# Patient Record
Sex: Female | Born: 1940 | Race: Black or African American | Hispanic: No | Smoking: Never smoker
Health system: Southern US, Community
[De-identification: ages and names within clinical notes are randomized; demographics above are authoritative.]

## PROBLEM LIST (undated history)

## (undated) DIAGNOSIS — I2510 Atherosclerotic heart disease of native coronary artery without angina pectoris: Secondary | ICD-10-CM

## (undated) DIAGNOSIS — I10 Essential (primary) hypertension: Secondary | ICD-10-CM

## (undated) DIAGNOSIS — I739 Peripheral vascular disease, unspecified: Secondary | ICD-10-CM

## (undated) DIAGNOSIS — J45909 Unspecified asthma, uncomplicated: Secondary | ICD-10-CM

## (undated) DIAGNOSIS — K219 Gastro-esophageal reflux disease without esophagitis: Secondary | ICD-10-CM

## (undated) DIAGNOSIS — R7303 Prediabetes: Secondary | ICD-10-CM

---

## 2009-01-20 ENCOUNTER — Observation Stay (HOSPITAL_COMMUNITY): Admission: RE | Admit: 2009-01-20 | Discharge: 2009-01-21 | Payer: Self-pay | Admitting: Orthopedic Surgery

## 2010-03-20 LAB — BASIC METABOLIC PANEL
BUN: 13 mg/dL (ref 6–23)
Calcium: 9.8 mg/dL (ref 8.4–10.5)
Creatinine, Ser: 1.01 mg/dL (ref 0.4–1.2)
GFR calc non Af Amer: 55 mL/min — ABNORMAL LOW (ref >60)

## 2010-03-20 LAB — POCT I-STAT 4, (NA,K, GLUC, HGB,HCT)
Hemoglobin: 12.9 g/dL (ref 12.0–15.0)
Sodium: 139 mEq/L (ref 135–145)

## 2011-05-26 IMAGING — CR DG OR PORTABLE SPINE
1 series · 1 of 1 positions shown · non-contrast
Comparison: Preoperative plain film examination 01/17/2009.

CLINICAL DATA: L3-4 foraminal disc.

PORTABLE SPINE

[view not recorded]
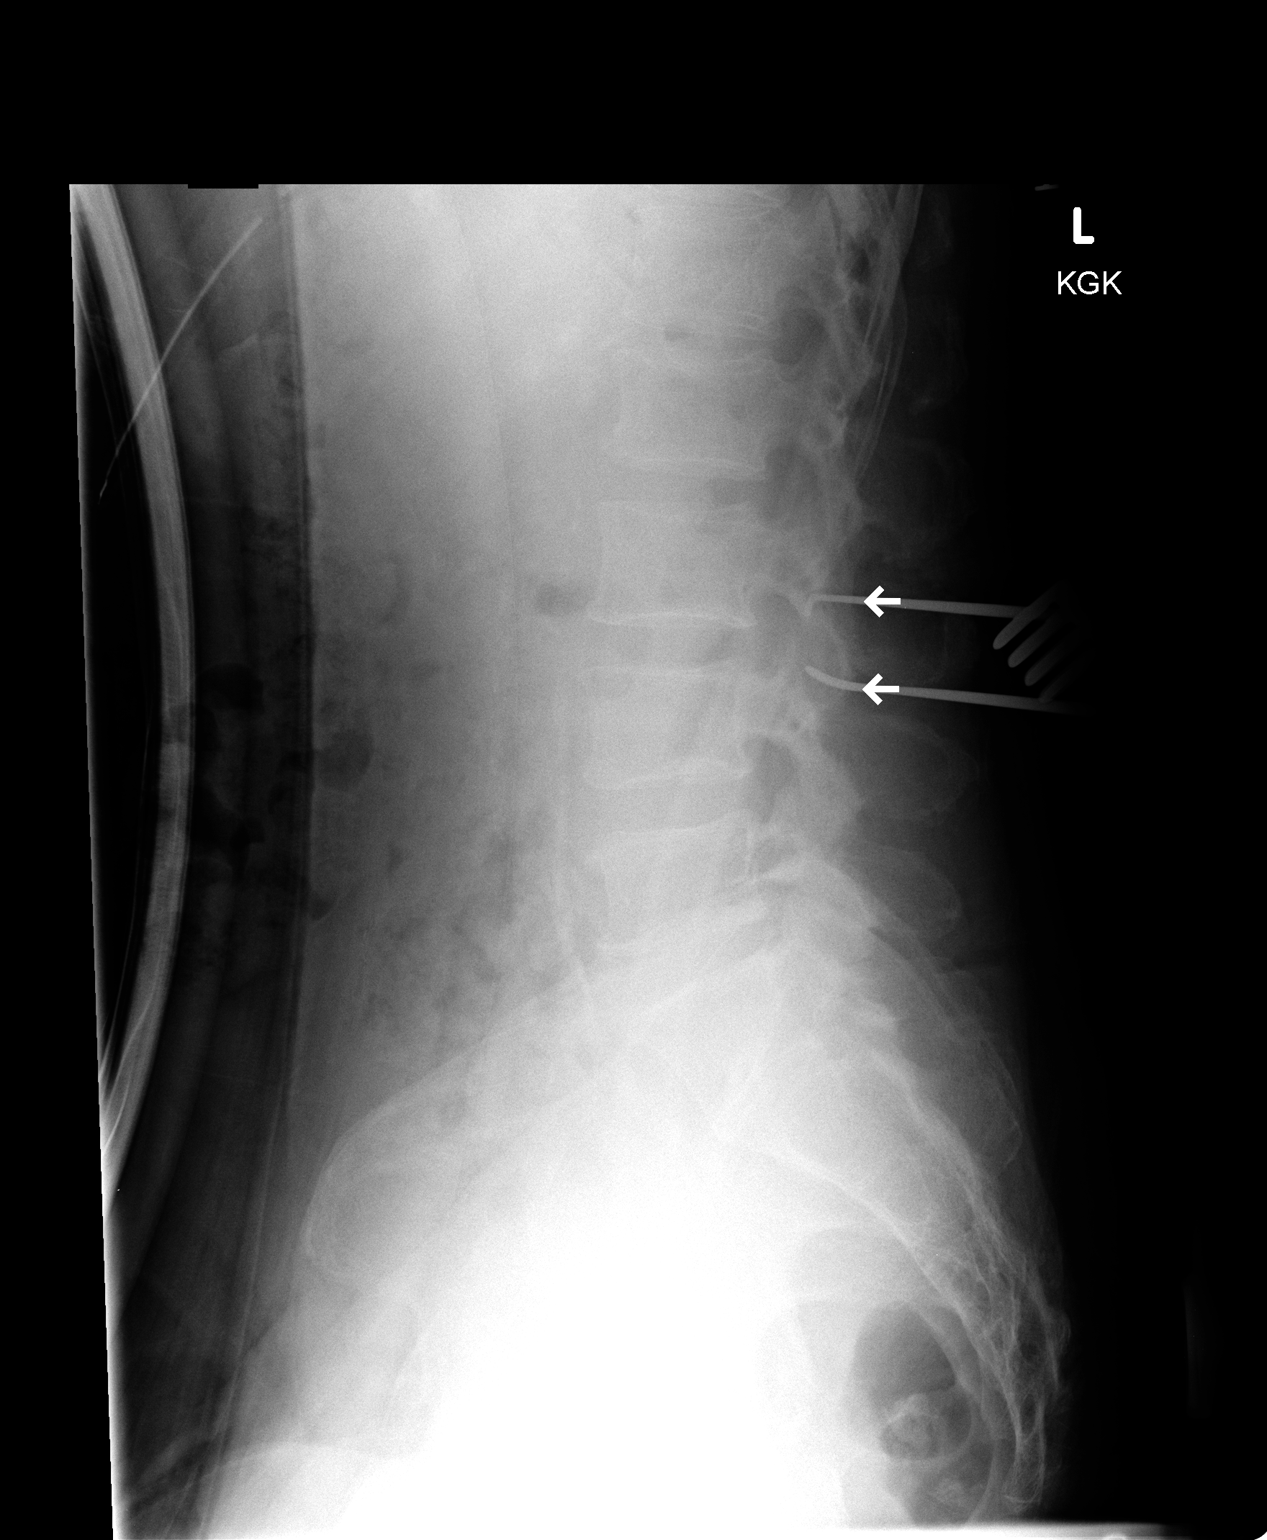

[1 of 1 positions shown; findings below may reference images not displayed]

FINDINGS: Transitional vertebra has previously been labeled S1 with
a rudimentary disc at the S1-S2 level.  Correlation with any
outside MR/post myelogram CT recommended to confirm level
assignment.

Utilizing this level assignment, metallic probes are seen just
above and just below the L3-4 disc space.
IMPRESSION: Utilizing prior level assignment, metallic probes are seen just
above and just below the L3-4 disc space.

## 2011-05-26 IMAGING — CR DG OR PORTABLE SPINE
1 series · 1 of 1 positions shown · non-contrast
Comparison: 01/20/2009.

CLINICAL DATA: Left L3-4 foraminal disc herniation.

PORTABLE SPINE

[view not recorded]
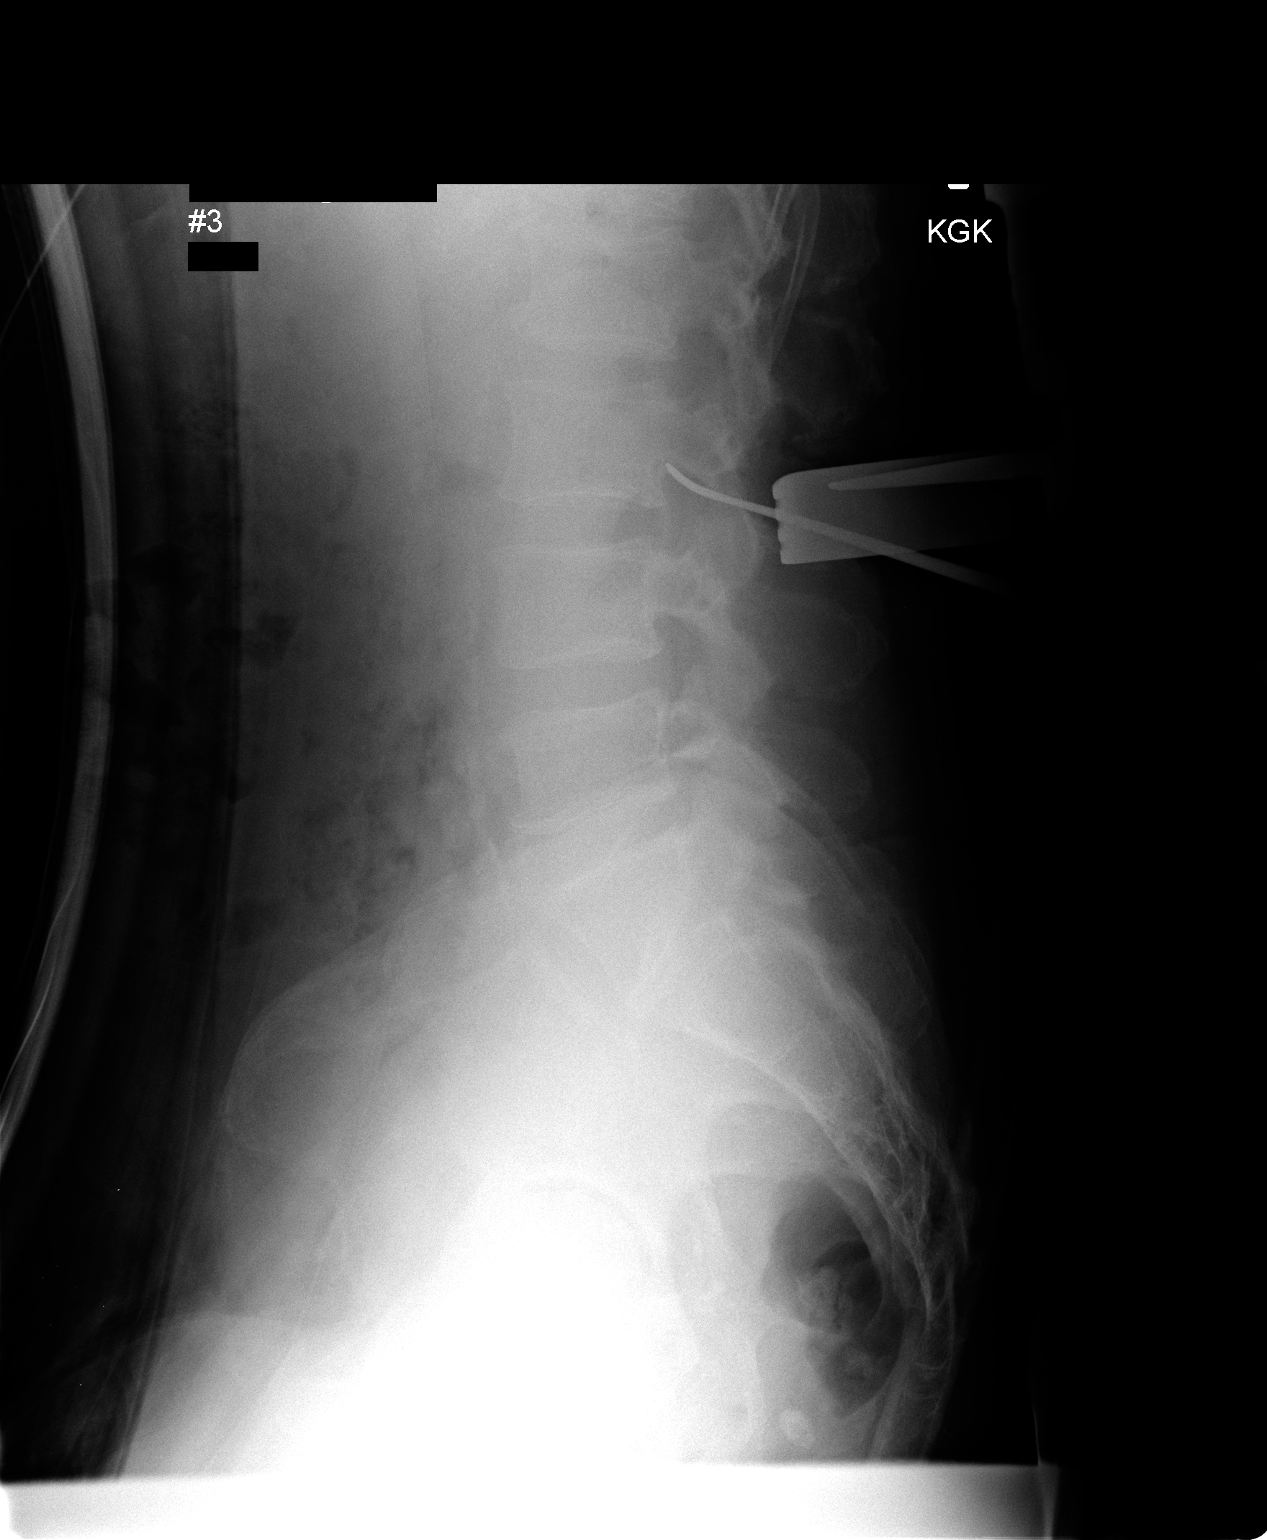

[1 of 1 positions shown; findings below may reference images not displayed]

FINDINGS: S1 is labeled a transitional vertebra as identified on
previous studies.

There is a surgical instrument located below the L3 pedicle.
Tissue spreaders are present posteriorly at this level.  Normal
lumbar alignment without fracture.
IMPRESSION: Surgical instrument below the pedicle of L3.

## 2022-04-18 ENCOUNTER — Encounter (HOSPITAL_BASED_OUTPATIENT_CLINIC_OR_DEPARTMENT_OTHER): Payer: Self-pay | Admitting: Orthopedic Surgery

## 2022-04-19 ENCOUNTER — Encounter (HOSPITAL_BASED_OUTPATIENT_CLINIC_OR_DEPARTMENT_OTHER)
Admission: RE | Admit: 2022-04-19 | Discharge: 2022-04-19 | Disposition: A | Discharge status: Home / Self Care | Payer: Medicaid Other | Source: Ambulatory Visit | Attending: Orthopedic Surgery | Admitting: Orthopedic Surgery

## 2022-04-19 ENCOUNTER — Encounter (HOSPITAL_BASED_OUTPATIENT_CLINIC_OR_DEPARTMENT_OTHER): Payer: Self-pay | Admitting: Orthopedic Surgery

## 2022-04-19 DIAGNOSIS — Z01818 Encounter for other preprocedural examination: Secondary | ICD-10-CM | POA: Diagnosis present

## 2022-04-19 DIAGNOSIS — I517 Cardiomegaly: Secondary | ICD-10-CM | POA: Diagnosis not present

## 2022-04-19 LAB — BASIC METABOLIC PANEL
Anion gap: 8 (ref 5–15)
BUN: 8 mg/dL (ref 8–23)
CO2: 24 mmol/L (ref 22–32)
Calcium: 9.3 mg/dL (ref 8.9–10.3)
Chloride: 108 mmol/L (ref 98–111)
Creatinine, Ser: 0.72 mg/dL (ref 0.44–1.00)
GFR, Estimated: >60 mL/min (ref >60)
Glucose, Bld: 132 mg/dL — ABNORMAL HIGH (ref 70–99)
Potassium: 3.7 mmol/L (ref 3.5–5.1)
Sodium: 140 mmol/L (ref 135–145)

## 2022-04-19 NOTE — Progress Notes (Signed)
Left voicemail with Dr. Georgia Duff surgery scheduler about requesting more cardiac notes/office visits or and tests such as an echo.

## 2022-04-24 NOTE — Progress Notes (Signed)
Called and left message with Dr. Percell Locus office, that we are waiting on cardiac office note.

## 2022-04-25 ENCOUNTER — Ambulatory Visit (HOSPITAL_BASED_OUTPATIENT_CLINIC_OR_DEPARTMENT_OTHER): Admission: RE | Admit: 2022-04-25 | Payer: Medicare HMO | Source: Home / Self Care | Admitting: Orthopedic Surgery

## 2022-04-25 DIAGNOSIS — Z01818 Encounter for other preprocedural examination: Secondary | ICD-10-CM

## 2022-04-25 HISTORY — DX: Peripheral vascular disease, unspecified: I73.9

## 2022-04-25 HISTORY — DX: Essential (primary) hypertension: I10

## 2022-04-25 HISTORY — DX: Unspecified asthma, uncomplicated: J45.909

## 2022-04-25 HISTORY — DX: Gastro-esophageal reflux disease without esophagitis: K21.9

## 2022-04-25 HISTORY — DX: Prediabetes: R73.03

## 2022-04-25 HISTORY — DX: Atherosclerotic heart disease of native coronary artery without angina pectoris: I25.10

## 2022-04-25 SURGERY — CARPAL TUNNEL RELEASE
Anesthesia: Monitor Anesthesia Care | Laterality: Right
